# Patient Record
Sex: Female | Born: 2000 | Race: White | Hispanic: No | Marital: Single | Smoking: Never smoker
Health system: Southern US, Community
[De-identification: ages and names within clinical notes are randomized; demographics above are authoritative.]

## PROBLEM LIST (undated history)

## (undated) DIAGNOSIS — Z789 Other specified health status: Secondary | ICD-10-CM

## (undated) HISTORY — PX: NO PAST SURGERIES: SHX2092

## (undated) HISTORY — DX: Other specified health status: Z78.9

---
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
## 2021-01-29 ENCOUNTER — Other Ambulatory Visit (HOSPITAL_COMMUNITY)
Admission: RE | Admit: 2021-01-29 | Discharge: 2021-01-29 | Disposition: A | Discharge status: Home / Self Care | Payer: Medicaid Other | Source: Ambulatory Visit | Attending: Obstetrics and Gynecology | Admitting: Obstetrics and Gynecology

## 2021-01-29 ENCOUNTER — Ambulatory Visit (INDEPENDENT_AMBULATORY_CARE_PROVIDER_SITE_OTHER): Payer: Medicaid Other | Admitting: *Deleted

## 2021-01-29 ENCOUNTER — Other Ambulatory Visit: Payer: Self-pay

## 2021-01-29 VITALS — BP 115/72 | HR 60

## 2021-01-29 DIAGNOSIS — Z113 Encounter for screening for infections with a predominantly sexual mode of transmission: Secondary | ICD-10-CM | POA: Diagnosis present

## 2021-01-29 DIAGNOSIS — N898 Other specified noninflammatory disorders of vagina: Secondary | ICD-10-CM

## 2021-01-29 NOTE — Progress Notes (Signed)
SUBJECTIVE:  20 y.o. female complains of vaginal odor for few day(s). Denies abnormal vaginal bleeding or significant pelvic pain or fever. No UTI symptoms. Denies history of known exposure to STD.  No LMP recorded.  OBJECTIVE:  She appears well, afebrile. Urine dipstick: not done.  ASSESSMENT:  STI Screen Vaginal Odor   PLAN:  GC, chlamydia, trichomonas, BVAG, CVAG probe sent to lab. Treatment: To be determined once lab results are received ROV prn if symptoms persist or worsen.

## 2021-01-31 NOTE — Progress Notes (Signed)
Patient seen and assessed by nursing staff.  Agree with documentation and plan.  

## 2021-02-01 LAB — CERVICOVAGINAL ANCILLARY ONLY
Bacterial Vaginitis (gardnerella): POSITIVE — AB
Candida Glabrata: NEGATIVE
Candida Vaginitis: NEGATIVE
Chlamydia: NEGATIVE
Comment: NEGATIVE
Comment: NEGATIVE
Comment: NEGATIVE
Comment: NEGATIVE
Comment: NEGATIVE
Comment: NORMAL
Neisseria Gonorrhea: NEGATIVE
Trichomonas: NEGATIVE

## 2021-02-02 ENCOUNTER — Other Ambulatory Visit: Payer: Self-pay | Admitting: Family Medicine

## 2021-02-02 MED ORDER — METRONIDAZOLE 500 MG PO TABS: 500.0000 mg | ORAL_TABLET | Freq: Two times a day (BID) | ORAL | 0 refills | Status: DC

## 2021-02-04 ENCOUNTER — Ambulatory Visit (INDEPENDENT_AMBULATORY_CARE_PROVIDER_SITE_OTHER): Payer: Medicaid Other | Admitting: Family Medicine

## 2021-02-04 ENCOUNTER — Other Ambulatory Visit: Payer: Self-pay

## 2021-02-04 ENCOUNTER — Encounter: Payer: Self-pay | Admitting: Family Medicine

## 2021-02-04 VITALS — BP 106/71 | HR 80 | Ht 64.0 in | Wt 146.0 lb

## 2021-02-04 DIAGNOSIS — Z30431 Encounter for routine checking of intrauterine contraceptive device: Secondary | ICD-10-CM | POA: Diagnosis not present

## 2021-02-04 NOTE — Progress Notes (Signed)
Pt presents for IUD check. She is experiencing discomfort during intercourse. Partner can feel the strings.  Post placental Liletta IUD was inserted 02/26/2020.

## 2021-02-05 ENCOUNTER — Encounter: Payer: Self-pay | Admitting: Family Medicine

## 2021-02-05 NOTE — Progress Notes (Signed)
   Subjective:    Patient ID: Margaret Chambers is a 20 y.o. female presenting with Contraception (IUD check )  on 02/04/2021  HPI:  For IUD string check. Partner can feel strings. Placed post-placentally.  Review of Systems  Constitutional:  Negative for chills and fever.  Respiratory:  Negative for shortness of breath.   Cardiovascular:  Negative for chest pain.  Gastrointestinal:  Negative for abdominal pain, nausea and vomiting.  Genitourinary:  Negative for dysuria.  Skin:  Negative for rash.     Objective:    BP 106/71   Pulse 80   Ht 5\' 4"  (1.626 m)   Wt 146 lb (66.2 kg)   LMP 01/28/2021   BMI 25.06 kg/m  Physical Exam Constitutional:      General: She is not in acute distress.    Appearance: She is well-developed.  HENT:     Head: Normocephalic and atraumatic.  Eyes:     General: No scleral icterus. Cardiovascular:     Rate and Rhythm: Normal rate.  Pulmonary:     Effort: Pulmonary effort is normal.  Abdominal:     Palpations: Abdomen is soft.  Genitourinary:    Comments: BUS normal, vagina is pink and rugated, cervix is parous without lesion IUD strings trimmed to 2.5 cm.  Musculoskeletal:     Cervical back: Neck supple.  Skin:    General: Skin is warm and dry.  Neurological:     Mental Status: She is alert and oriented to person, place, and time.        Assessment & Plan:  IUD check up - strings trimmed.  Return if symptoms worsen or fail to improve.  01/30/2021 02/05/2021 2:22 PM

## 2021-07-24 IMAGING — US US MFM OB FOLLOW-UP
1 series · 14 of 28 positions shown · non-contrast
Comparison: none

[Series 1: us mfm ob follow-up · 43 acquisitions, 14 frames shown]
[im 2/43]
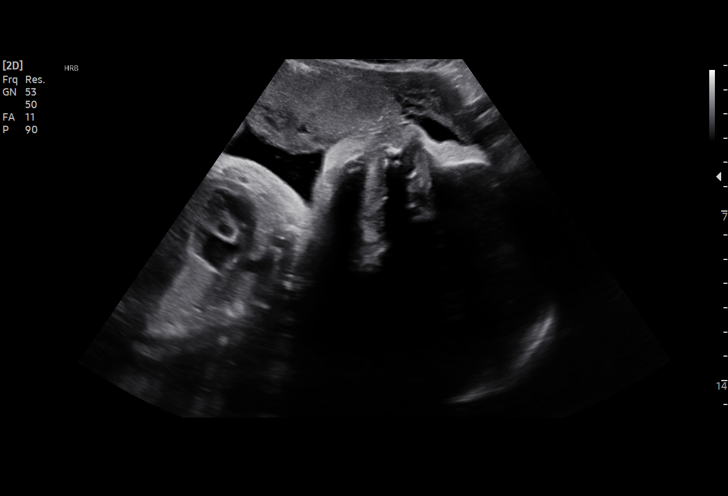
[im 5/43]
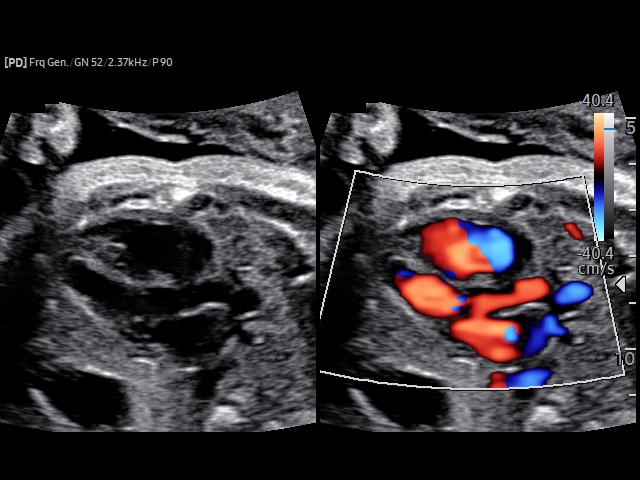
[im 8/43]
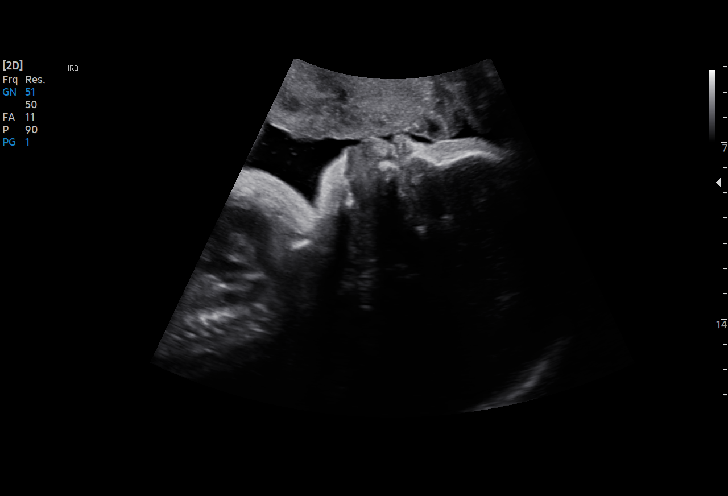
[im 11/43]
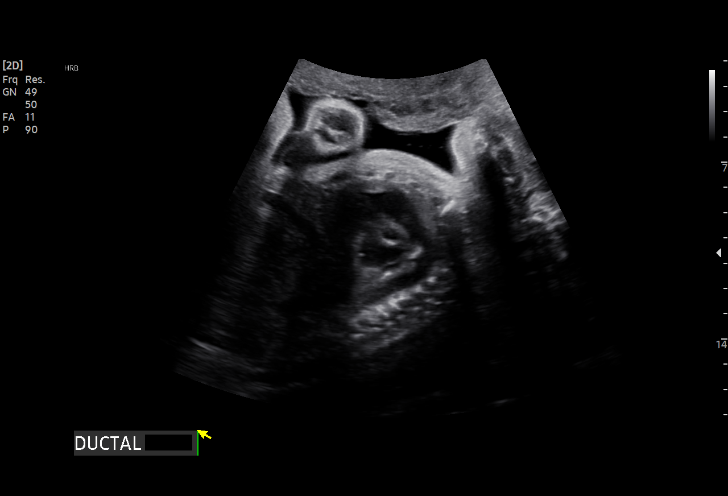
[im 15/43]
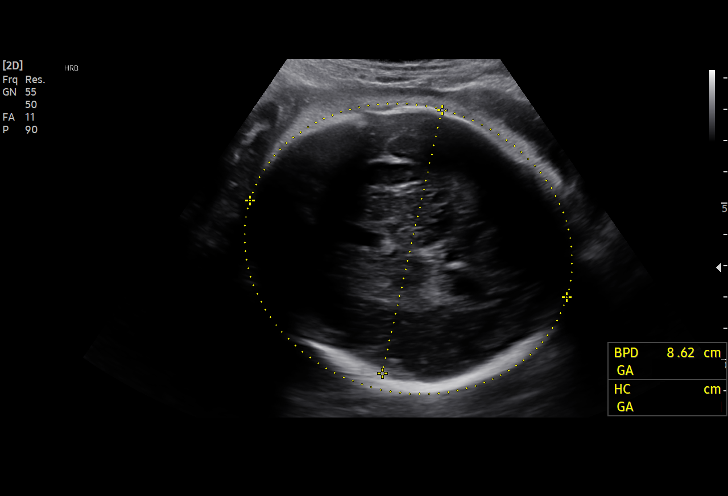
[im 18/43]
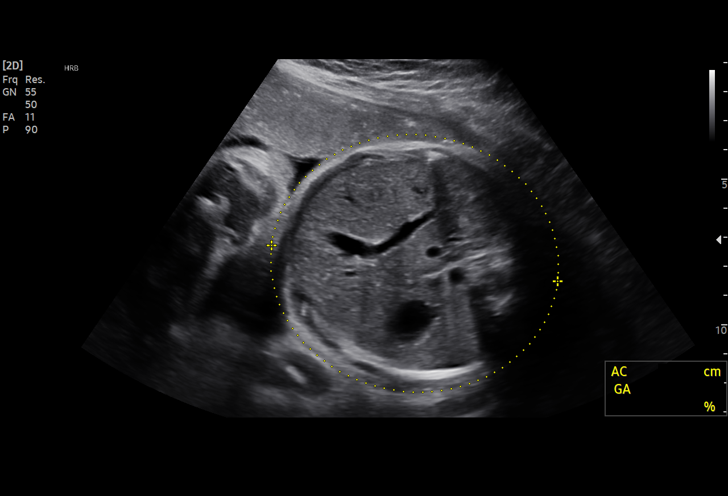
[im 21/43]
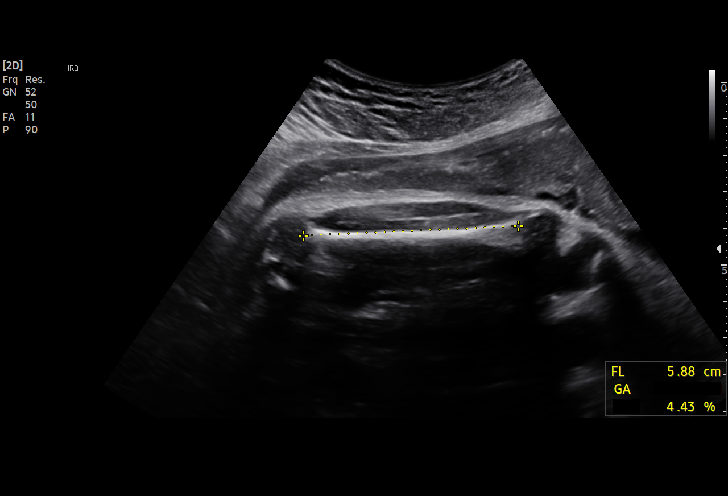
[im 24/43]
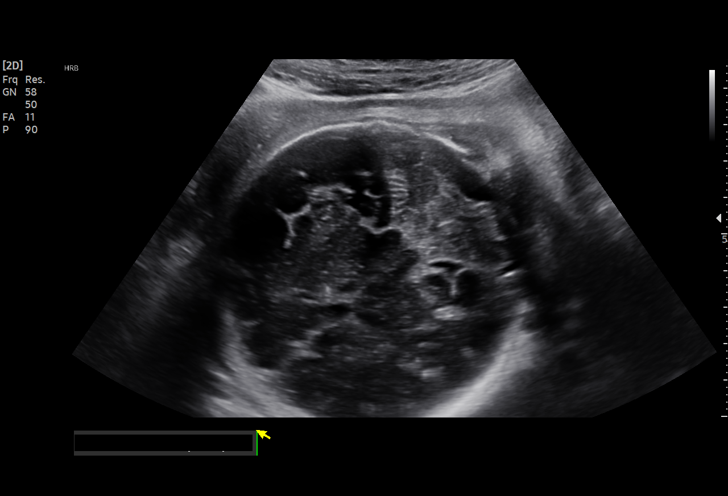
[im 27/43]
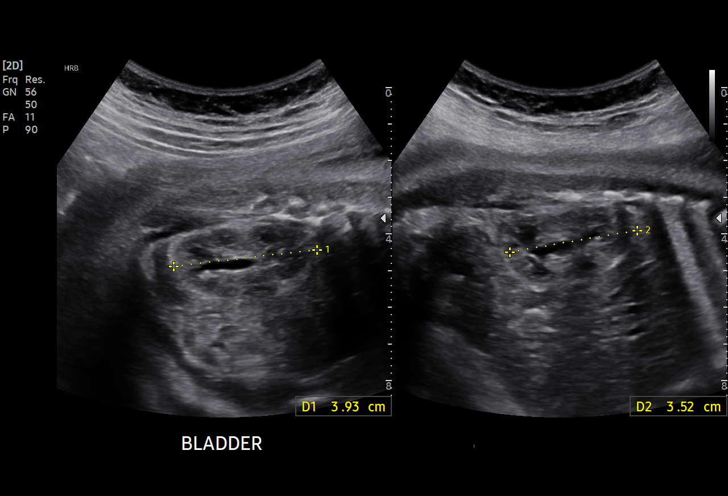
[im 30/43]
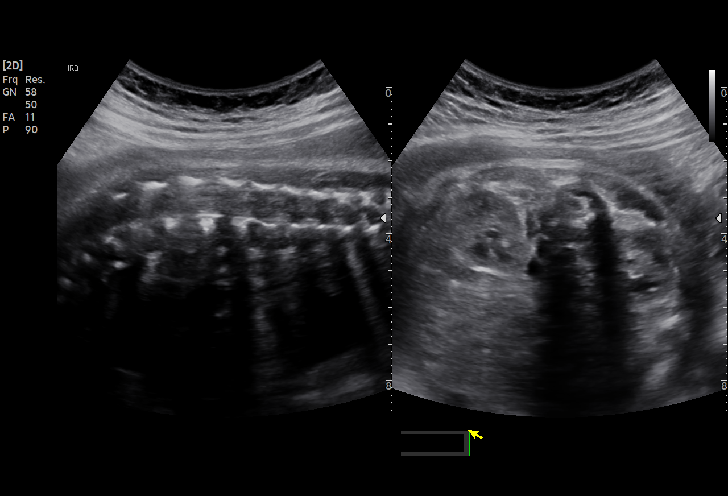
[im 33/43]
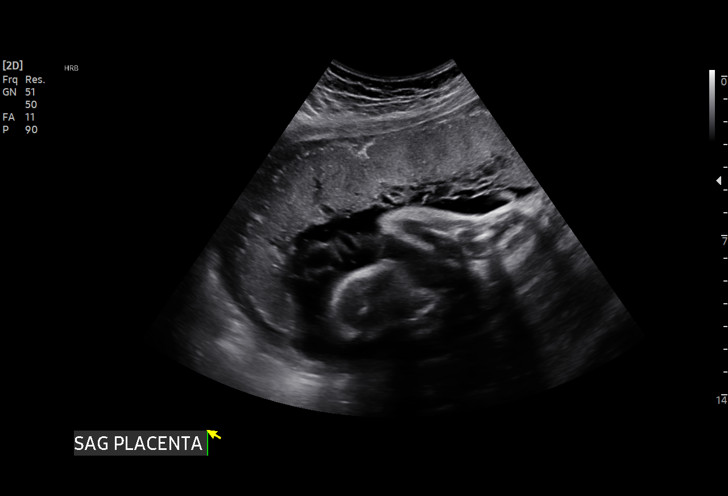
[im 36/43]
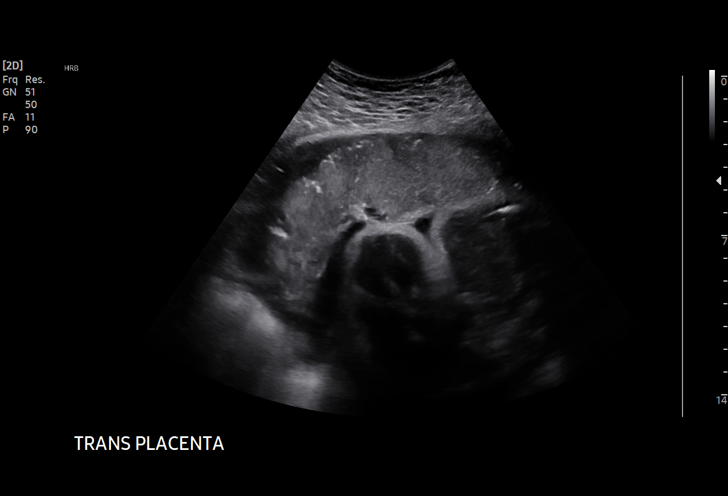
[im 39/43]
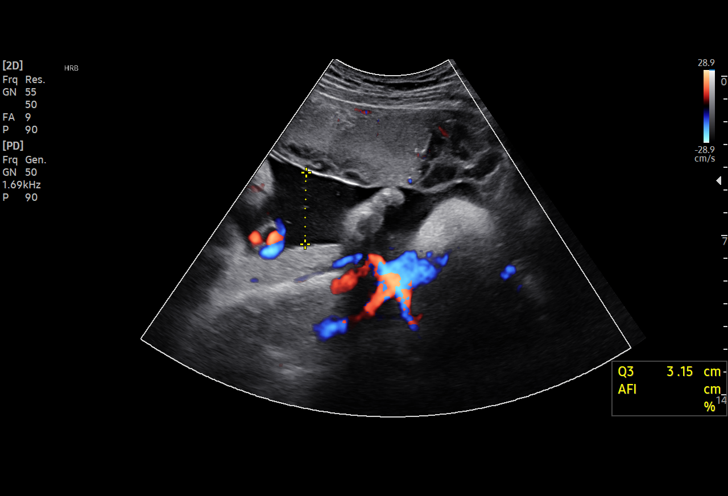
[im 43/43]
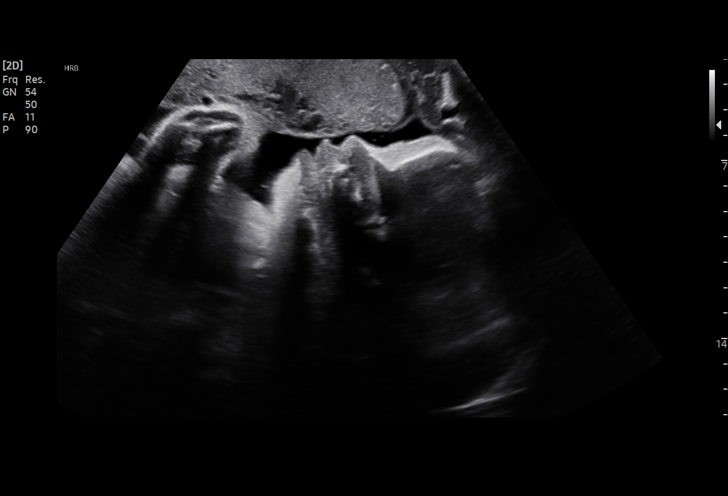

[14 of 28 positions shown; findings below may reference images not displayed]

Indications

 Late to prenatal care, third trimester
 Teen pregnancy (18 y.o.)
 Encounter for other antenatal screening
 follow-up
 Low Risk NIPS, Neg Horizon, Neg CF
 Marijuana Use
 32 weeks gestation of pregnancy
Fetal Evaluation

 Num Of Fetuses:          1
 Fetal Heart Rate(bpm):   138
 Cardiac Activity:        Observed
 Presentation:            Cephalic
 Placenta:                Anterior
 P. Cord Insertion:       Previously Visualized

 Amniotic Fluid
 AFI FV:      Within normal limits

 AFI Sum(cm)     %Tile       Largest Pocket(cm)
 14.3            50

 RUQ(cm)       RLQ(cm)       LUQ(cm)        LLQ(cm)
 3.8           2
Biometry
 BPD:      85.3  mm     G. Age:  34w 3d         89  %    CI:        77.69   %    70 - 86
                                                         FL/HC:       19.2  %    19.9 -
 HC:      306.3  mm     G. Age:  34w 1d         54  %    HC/AC:       1.05       0.96 -
 AC:      292.9  mm     G. Age:  33w 2d         71  %    FL/BPD:      68.9  %    71 - 87
 FL:       58.8  mm     G. Age:  30w 5d        4.4  %    FL/AC:       20.1  %    20 - 24
 LV:        4.9  mm

 Est. FW:    2337   gm     4 lb 8 oz     44  %
OB History

 Gravidity:    1         Term:   0
 Living:       0
Gestational Age

 LMP:           32w 4d        Date:  05/26/19                 EDD:   03/01/20
 U/S Today:     33w 1d                                        EDD:   02/26/20
 Best:          32w 4d     Det. By:  LMP  (05/26/19)          EDD:   03/01/20
Anatomy

 Cranium:               Appears normal         Aortic Arch:            Previously seen
 Cavum:                 Appears normal         Ductal Arch:            Appears normal
 Ventricles:            Appears normal         Diaphragm:              Appears normal
 Choroid Plexus:        Previously seen        Stomach:                Appears normal, left
                                                                       sided
 Cerebellum:            Previously seen        Abdomen:                Appears normal
 Posterior Fossa:       Appears normal         Abdominal Wall:         Previously seen
 Nuchal Fold:           Not applicable (>20    Cord Vessels:           Previously seen
                        wks GA)
 Face:                  Appears normal         Kidneys:                Appear normal
                        (orbits and profile)
 Lips:                  Appears normal         Bladder:                Appears normal
 Thoracic:              Appears normal         Spine:                  Ltd views no
                                                                       intracranial signs of
                                                                       NTD
 Heart:                 Previously seen        Upper Extremities:      Previously seen
 RVOT:                  Previously seen        Lower Extremities:      Previously seen
 LVOT:                  Appears normal

 Other:  Fetus appears to be a male. Technically difficult due to fetal position.
Cervix Uterus Adnexa

 Cervix
 Not visualized (advanced GA >72wks)
Impression

 Follow up growth given maternal teen pregnancy
 Normal interval growth with measurements consistent with
 dates.
Recommendations

 Consider follow up as clinically indicated.

## 2021-11-18 ENCOUNTER — Ambulatory Visit: Payer: Medicaid Other | Admitting: Obstetrics and Gynecology

## 2021-11-30 ENCOUNTER — Other Ambulatory Visit (HOSPITAL_COMMUNITY)
Admission: RE | Admit: 2021-11-30 | Discharge: 2021-11-30 | Disposition: A | Discharge status: Home / Self Care | Payer: Medicaid Other | Source: Ambulatory Visit | Attending: Obstetrics & Gynecology | Admitting: Obstetrics & Gynecology

## 2021-11-30 ENCOUNTER — Ambulatory Visit (INDEPENDENT_AMBULATORY_CARE_PROVIDER_SITE_OTHER): Payer: Medicaid Other

## 2021-11-30 VITALS — BP 103/61 | HR 90

## 2021-11-30 DIAGNOSIS — Z113 Encounter for screening for infections with a predominantly sexual mode of transmission: Secondary | ICD-10-CM | POA: Diagnosis not present

## 2021-11-30 DIAGNOSIS — B9689 Other specified bacterial agents as the cause of diseases classified elsewhere: Secondary | ICD-10-CM

## 2021-11-30 NOTE — Progress Notes (Signed)
SUBJECTIVE:  21 y.o. female who desires a STI screen. Denies abnormal vaginal discharge, bleeding or significant pelvic pain. No UTI symptoms. Denies history of known exposure to STD.  No LMP recorded. (Menstrual status: IUD).  OBJECTIVE:  She appears well.   ASSESSMENT:  STI Screen   PLAN:  Pt offered STI blood screening-not indicated GC, chlamydia, and trichomonas probe sent to lab. Pt wants BV and Yeast testing as well Treatment: To be determined once lab results are received.  Pt follow up as needed.

## 2021-12-01 LAB — CERVICOVAGINAL ANCILLARY ONLY
Bacterial Vaginitis (gardnerella): POSITIVE — AB
Candida Glabrata: NEGATIVE
Candida Vaginitis: NEGATIVE
Chlamydia: NEGATIVE
Comment: NEGATIVE
Comment: NEGATIVE
Comment: NEGATIVE
Comment: NEGATIVE
Comment: NEGATIVE
Comment: NORMAL
Neisseria Gonorrhea: NEGATIVE
Trichomonas: NEGATIVE

## 2021-12-02 MED ORDER — METRONIDAZOLE 500 MG PO TABS: 500.0000 mg | ORAL_TABLET | Freq: Two times a day (BID) | ORAL | 0 refills | Status: AC

## 2021-12-30 ENCOUNTER — Ambulatory Visit: Payer: Medicaid Other | Admitting: Obstetrics and Gynecology

## 2022-04-14 ENCOUNTER — Encounter: Payer: Self-pay | Admitting: Advanced Practice Midwife

## 2022-04-14 ENCOUNTER — Ambulatory Visit (INDEPENDENT_AMBULATORY_CARE_PROVIDER_SITE_OTHER): Payer: Medicaid Other | Admitting: Advanced Practice Midwife

## 2022-04-14 ENCOUNTER — Other Ambulatory Visit (HOSPITAL_COMMUNITY)
Admission: RE | Admit: 2022-04-14 | Discharge: 2022-04-14 | Disposition: A | Discharge status: Home / Self Care | Payer: Medicaid Other | Source: Ambulatory Visit | Attending: Advanced Practice Midwife | Admitting: Advanced Practice Midwife

## 2022-04-14 VITALS — BP 119/72 | HR 90 | Wt 129.0 lb

## 2022-04-14 DIAGNOSIS — N760 Acute vaginitis: Secondary | ICD-10-CM

## 2022-04-14 DIAGNOSIS — B9689 Other specified bacterial agents as the cause of diseases classified elsewhere: Secondary | ICD-10-CM

## 2022-04-14 DIAGNOSIS — Z975 Presence of (intrauterine) contraceptive device: Secondary | ICD-10-CM | POA: Diagnosis not present

## 2022-04-14 DIAGNOSIS — Z01419 Encounter for gynecological examination (general) (routine) without abnormal findings: Secondary | ICD-10-CM

## 2022-04-14 MED ORDER — METRONIDAZOLE 500 MG PO TABS: 500.0000 mg | ORAL_TABLET | Freq: Two times a day (BID) | ORAL | 0 refills | Status: DC

## 2022-04-14 NOTE — Progress Notes (Signed)
Patient presents for GYN/Annual  visit today.  LMP: irregular w/IUD will be very light. Contraception: IUD  STD Screening: Desires Full Panel. Family Hx of Breast Cancer: None    CC: yellowish discharge and vaginal odor.

## 2022-04-14 NOTE — Progress Notes (Signed)
GYNECOLOGY ANNUAL PREVENTATIVE CARE ENCOUNTER NOTE  History:     Margaret Chambers is a 21 y.o. G68P1001 female here for a routine annual gynecologic exam.  Current complaints: none. Patient requests STD screening.   Denies abnormal vaginal bleeding, discharge, pelvic pain, problems with intercourse or other gynecologic concerns.    Gynecologic History No LMP recorded. (Menstrual status: IUD). Contraception: IUD Last Pap: N/A age 68.   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     No current facility-administered medications on file prior to visit.    No Known Allergies  Social History:  reports that she has never smoked. She has never used smokeless tobacco. She reports that she does not currently use alcohol. She reports current drug use. Drug: Marijuana.  No family history on file.  The following portions of the patient's history were reviewed and updated as appropriate: allergies, current medications, past family history, past medical history, past social history, past surgical history and problem list.  Review of Systems Pertinent items noted in HPI and remainder of comprehensive ROS otherwise negative.  Physical Exam:  BP 119/72   Pulse 90   Wt 129 lb (58.5 kg)   BMI 22.14 kg/m  CONSTITUTIONAL: Well-developed, well-nourished female in no acute distress.  HENT:  Normocephalic,  atraumatic, External right and left ear normal.  EYES: Conjunctivae and EOM are normal. Pupils are equal, round, and reactive to light. No scleral icterus.  SKIN: Skin is warm and dry. No rash noted. Not diaphoretic. No erythema. No pallor. MUSCULOSKELETAL: Normal range of motion. No tenderness.  No cyanosis, clubbing, or edema. NEUROLOGIC: Alert and oriented to person, place, and time. Normal reflexes, muscle tone coordination.  PSYCHIATRIC: Normal mood and affect. Normal behavior. Normal judgment and thought content. CARDIOVASCULAR: Normal heart rate noted RESPIRATORY: normal WOB PELVIC: Normal appearing external genitalia and urethral meatus; normal appearing vaginal mucosa and cervix.  Thin whitish blue discharge throughout vault c/w Bacterial Vaginosis.   Performed in the presence of a chaperone.   Assessment and Plan:    1. Well woman exam with routine gynecological exam - No concerning findings - Hepatitis B surface antigen - Hepatitis C antibody - RPR - HIV Antibody (routine testing w rflx)  2. IUD (intrauterine device) in place - Liletta placed postplacentally on 02/26/2020 - No complaints - Strings appropriate length  3. Bacterial vaginosis - Thin white discharge throughout vault - Treat presumptively while swab is processed - Cervicovaginal ancillary only - metroNIDAZOLE (FLAGYL) 500 MG tablet; Take 1 tablet (500 mg total) by mouth 2 (two) times daily.  Dispense: 14 tablet; Refill: 0  Will follow up on results of cervicovaginal swab and manage accordingly Routine preventative health maintenance measures emphasized. Please refer to After Visit Summary  for other counseling recommendations.      Clayton Bibles, MSA, MSN, CNM Certified Nurse Midwife, Biochemist, clinical for [employer], [facility]

## 2022-04-15 LAB — HEPATITIS B SURFACE ANTIGEN: Hepatitis B Surface Ag: NEGATIVE

## 2022-04-15 LAB — RPR: RPR Ser Ql: NONREACTIVE

## 2022-04-15 LAB — HIV ANTIBODY (ROUTINE TESTING W REFLEX): HIV Screen 4th Generation wRfx: NONREACTIVE

## 2022-04-15 LAB — HEPATITIS C ANTIBODY: Hep C Virus Ab: NONREACTIVE

## 2022-04-18 LAB — CERVICOVAGINAL ANCILLARY ONLY
Chlamydia: NEGATIVE
Comment: NEGATIVE
Comment: NEGATIVE
Comment: NORMAL
Neisseria Gonorrhea: NEGATIVE
Trichomonas: NEGATIVE

## 2022-11-15 ENCOUNTER — Encounter: Payer: Self-pay | Admitting: Family Medicine

## 2022-12-21 ENCOUNTER — Encounter: Payer: Self-pay | Admitting: Family Medicine

## 2022-12-21 ENCOUNTER — Ambulatory Visit (INDEPENDENT_AMBULATORY_CARE_PROVIDER_SITE_OTHER): Payer: Self-pay | Admitting: Family Medicine

## 2022-12-21 VITALS — BP 111/73 | HR 86 | Wt 132.0 lb

## 2022-12-21 DIAGNOSIS — Z30432 Encounter for removal of intrauterine contraceptive device: Secondary | ICD-10-CM

## 2022-12-21 DIAGNOSIS — Z30011 Encounter for initial prescription of contraceptive pills: Secondary | ICD-10-CM

## 2022-12-21 MED ORDER — NORETHIN ACE-ETH ESTRAD-FE 1-20 MG-MCG PO TABS: 1.0000 | ORAL_TABLET | Freq: Every day | ORAL | 3 refills | Status: DC

## 2022-12-21 NOTE — Progress Notes (Signed)
RGYN patient here for problem visit.  Pt c/o irregular bleeding w/current birth Control Liletta whole month of June and into July recently stopped   Inserted 02/2020.  Pt wants IUD removed and wants pills/Junel.

## 2022-12-21 NOTE — Progress Notes (Signed)
    Subjective:    Patient ID: Margaret Chambers is a 22 y.o. female presenting with Menstrual Problem  on 12/21/2022  HPI: Here to change contraception methods. Has Liletta placed post-placentally and has been having bleeding post intercourse and partner can feel strings. Wants to try COCs. Does not want a pregnancy in the next year.  Review of Systems  Constitutional:  Negative for chills and fever.  Respiratory:  Negative for shortness of breath.   Cardiovascular:  Negative for chest pain.  Gastrointestinal:  Negative for abdominal pain, nausea and vomiting.  Genitourinary:  Negative for dysuria.  Skin:  Negative for rash.      Objective:    BP 111/73   Pulse 86   Wt 132 lb (59.9 kg)   BMI 22.66 kg/m  Physical Exam Exam conducted with a chaperone present.  Constitutional:      General: She is not in acute distress.    Appearance: She is well-developed.  HENT:     Head: Normocephalic and atraumatic.  Eyes:     General: No scleral icterus. Cardiovascular:     Rate and Rhythm: Normal rate.  Pulmonary:     Effort: Pulmonary effort is normal.  Abdominal:     Palpations: Abdomen is soft.  Genitourinary:    Comments: BUS normal, vagina is pink and rugated, cervix is parous without lesion Musculoskeletal:     Cervical back: Neck supple.  Skin:    General: Skin is warm and dry.  Neurological:     Mental Status: She is alert and oriented to person, place, and time.    Procedure: Speculum placed inside vagina.  Cervix visualized.  Strings grasped with ring forceps.  IUD removed intact.     Assessment & Plan:   Initiation of oral contraception - Plan: norethindrone-ethinyl estradiol-FE (JUNEL FE 1/20) 1-20 MG-MCG tablet  Encounter for IUD removal  Return in about 3 months (around 03/23/2023) for a CPE.  Reva Bores, MD 12/21/2022 11:20 AM

## 2023-10-09 ENCOUNTER — Ambulatory Visit: Payer: Self-pay

## 2023-10-09 VITALS — BP 124/60 | Ht 63.0 in | Wt 157.0 lb

## 2023-10-09 DIAGNOSIS — Z309 Encounter for contraceptive management, unspecified: Secondary | ICD-10-CM

## 2023-10-09 DIAGNOSIS — Z3201 Encounter for pregnancy test, result positive: Secondary | ICD-10-CM

## 2023-10-09 LAB — PREGNANCY, URINE: Preg Test, Ur: POSITIVE — AB

## 2023-10-09 MED ORDER — PRENATAL 27-0.8 MG PO TABS: 1.0000 | ORAL_TABLET | Freq: Every day | ORAL | Status: AC

## 2023-10-09 NOTE — Progress Notes (Signed)
 UPT positive. Unsure of plans as of right now. Patient unsure of LMP, so encouraged patient to schedule U/S to help determine her plans. List of providers given to patient.   The patient was dispensed prenatal vtiamins #100 today. I provided counseling today regarding the medication. We discussed the medication, the side effects and when to call clinic.   Positive pregnancy packet reviewed and given to patient. Also counseled on hydration and when to seek medical attention.   Patient given the opportunity to ask questions. Questions answered.  Clare Critchley, RN

## 2023-10-10 ENCOUNTER — Ambulatory Visit (INDEPENDENT_AMBULATORY_CARE_PROVIDER_SITE_OTHER): Payer: Self-pay | Admitting: *Deleted

## 2023-10-10 ENCOUNTER — Other Ambulatory Visit: Payer: Self-pay

## 2023-10-10 VITALS — BP 122/72 | HR 91 | Wt 157.0 lb

## 2023-10-10 DIAGNOSIS — O3680X0 Pregnancy with inconclusive fetal viability, not applicable or unspecified: Secondary | ICD-10-CM

## 2023-10-10 DIAGNOSIS — Z3A01 Less than 8 weeks gestation of pregnancy: Secondary | ICD-10-CM

## 2023-10-10 DIAGNOSIS — Z3201 Encounter for pregnancy test, result positive: Secondary | ICD-10-CM

## 2023-10-10 DIAGNOSIS — Z3480 Encounter for supervision of other normal pregnancy, unspecified trimester: Secondary | ICD-10-CM

## 2023-10-10 DIAGNOSIS — Z3481 Encounter for supervision of other normal pregnancy, first trimester: Secondary | ICD-10-CM

## 2023-10-10 NOTE — Progress Notes (Signed)
 New OB Intake  I explained I am completing New OB Intake today. We discussed EDD of 05/25/2024, by Ultrasound. Pt is G2P1001. I reviewed her allergies, medications and Medical/Surgical/OB history.    Patient Active Problem List   Diagnosis Date Noted   Supervision of other normal pregnancy, antepartum 10/10/2023    Concerns addressed today  Patient informed that the ultrasound is considered a limited obstetric ultrasound and is not intended to be a complete ultrasound exam.  Patient also informed that the ultrasound is not being completed with the intent of assessing for fetal or placental anomalies or any pelvic abnormalities. Explained that the purpose of today's ultrasound is to assess for viability.  Patient acknowledges the purpose of the exam and the limitations of the study.     Delivery Plans Plans to deliver at [facility]. Discussed the nature of our practice with multiple providers including residents and students. Due to the size of the practice, the delivering provider may not be the same as those providing prenatal care.   MyChart/Babyscripts MyChart access verified. I explained pt will have some visits in office and some virtually. Babyscripts app discussed and ordered.   Blood Pressure Cuff Blood pressure cuff discussed and given Discussed to be used for virtual visits and or if needed BP checks weekly.  Anatomy US  Explained first scheduled US  will be around 19 weeks.   Last Pap Need pap at new OB  First visit review I reviewed new OB appt with patient. Explained pt will be seen by Dr Adriana Hopping at first visit. Discussed [facility] [place] genetic screening with patient will get panorama collected at [facility]. Routine prenatal labs to be collected at new OB.    Bettey Browning, RN 10/10/2023  3:59 PM

## 2023-11-08 ENCOUNTER — Encounter: Payer: Self-pay | Admitting: Family Medicine

## 2023-12-31 ENCOUNTER — Other Ambulatory Visit: Payer: Self-pay | Admitting: Family Medicine

## 2024-01-01 ENCOUNTER — Encounter: Payer: Self-pay | Admitting: Family Medicine

## 2024-01-03 ENCOUNTER — Telehealth: Payer: Self-pay | Admitting: *Deleted

## 2024-01-03 ENCOUNTER — Encounter: Payer: Self-pay | Admitting: *Deleted

## 2024-01-03 MED ORDER — NORETHIN ACE-ETH ESTRAD-FE 1-20 MG-MCG PO TABS: 1.0000 | ORAL_TABLET | Freq: Every day | ORAL | 11 refills | Status: AC

## 2024-01-03 NOTE — Telephone Encounter (Signed)
 Attempted to call pt regarding her mychart message about refilling her birth control. Left message for her to call me back.

## 2024-01-03 NOTE — Telephone Encounter (Signed)
 Pt called back, and did let me know she did decide to terminate her pregnancy in June and did have an US  confirming her pregnancy has resolved on June 4th.   Will send in OCP refill for her and she can start with next cycle and also reminded pt she is due for a pap this year ans she can make an appointment for that.

## 2024-01-04 ENCOUNTER — Ambulatory Visit: Payer: Self-pay

## 2024-01-04 ENCOUNTER — Other Ambulatory Visit: Payer: Self-pay

## 2024-05-02 ENCOUNTER — Encounter: Payer: Self-pay | Admitting: Family Medicine

## 2024-05-02 ENCOUNTER — Other Ambulatory Visit (HOSPITAL_COMMUNITY)
Admission: RE | Admit: 2024-05-02 | Discharge: 2024-05-02 | Disposition: A | Discharge status: Home / Self Care | Source: Ambulatory Visit | Attending: Family Medicine | Admitting: Family Medicine

## 2024-05-02 ENCOUNTER — Ambulatory Visit (INDEPENDENT_AMBULATORY_CARE_PROVIDER_SITE_OTHER): Admitting: Family Medicine

## 2024-05-02 VITALS — BP 120/71 | Wt 163.0 lb

## 2024-05-02 DIAGNOSIS — Z124 Encounter for screening for malignant neoplasm of cervix: Secondary | ICD-10-CM

## 2024-05-02 DIAGNOSIS — Z01419 Encounter for gynecological examination (general) (routine) without abnormal findings: Secondary | ICD-10-CM

## 2024-05-02 DIAGNOSIS — N939 Abnormal uterine and vaginal bleeding, unspecified: Secondary | ICD-10-CM | POA: Insufficient documentation

## 2024-05-02 MED ORDER — DOXYCYCLINE HYCLATE 100 MG PO CAPS: 100.0000 mg | ORAL_CAPSULE | Freq: Two times a day (BID) | ORAL | 0 refills | Status: AC

## 2024-05-02 NOTE — Patient Instructions (Signed)

## 2024-05-02 NOTE — Progress Notes (Signed)
 Subjective:     Margaret Chambers is a 23 y.o. female and is here for a comprehensive physical exam. The patient reports problems - bleeding.  Had TAB in 09/2023. Taking COCs. Sees some blood clots with with urination.  The following portions of the patient's history were reviewed and updated as appropriate: allergies, current medications, past family history, past medical history, past social history, past surgical history, and problem list.  Review of Systems Pertinent items noted in HPI and remainder of comprehensive ROS otherwise negative.   Objective:  Chaperone present for exam   BP 120/71   Wt 163 lb (73.9 kg)   LMP  (LMP Unknown) Comment: pt states she has had bleeding that is coming and going  Breastfeeding No   BMI 28.87 kg/m  General appearance: alert, cooperative, and appears stated age Head: Normocephalic, without obvious abnormality, atraumatic Neck: no adenopathy, supple, symmetrical, trachea midline, and thyroid not enlarged, symmetric, no tenderness/mass/nodules Lungs: clear to auscultation bilaterally Heart: regular rate and rhythm, S1, S2 normal, no murmur, click, rub or gallop Abdomen: soft, non-tender; bowel sounds normal; no masses,  no organomegaly Pelvic: cervix normal in appearance, external genitalia normal, no adnexal masses or tenderness, no cervical motion tenderness, uterus normal size, shape, and consistency, and vagina normal without discharge Extremities: extremities normal, atraumatic, no cyanosis or edema Pulses: 2+ and symmetric Skin: Skin color, texture, turgor normal. No rashes or lesions Lymph nodes: Cervical, supraclavicular, and axillary nodes normal. Neurologic: Grossly normal       05/02/2024    2:06 PM  GAD 7 : Generalized Anxiety Score  Nervous, Anxious, on Edge 0  Control/stop worrying 0  Worry too much - different things 0  Trouble relaxing 0  Restless 1  Easily annoyed or irritable 1  Afraid - awful might happen 0  Total GAD 7 Score  2    Flowsheet Row Office Visit from 05/02/2024 in [facility]  PHQ-9 Total Score 3    Assessment:    Healthy female exam.      Plan:   Problem List Items Addressed This Visit       Unprioritized   Abnormal uterine bleeding   99214 - new and not improving - check pelvic sonogram. Will add doxy. If does not resolve, consider other treatment options.      Relevant Medications   doxycycline (VIBRAMYCIN) 100 MG capsule   Other Relevant Orders   US  PELVIC COMPLETE WITH TRANSVAGINAL   CBC   TSH   Other Visit Diagnoses       Screening for malignant neoplasm of cervix    -  Primary   Pap smear with GC/Chlam testing   Relevant Orders   Cytology - PAP( [place])     Encounter for gynecological examination without abnormal finding       GAD7 and PHQ9 reviewed. Declined flu shot. No lab work due to age.         See After Visit Summary for Counseling Recommendations

## 2024-05-02 NOTE — Assessment & Plan Note (Signed)
 00785 - new and not improving - check pelvic sonogram. Will add doxy. If does not resolve, consider other treatment options.

## 2024-05-03 ENCOUNTER — Ambulatory Visit: Payer: Self-pay | Admitting: Family Medicine

## 2024-05-03 LAB — CBC
Hematocrit: 39 % (ref 34.0–46.6)
Hemoglobin: 12.8 g/dL (ref 11.1–15.9)
MCH: 29.3 pg (ref 26.6–33.0)
MCHC: 32.8 g/dL (ref 31.5–35.7)
MCV: 89 fL (ref 79–97)
Platelets: 327 x10E3/uL (ref 150–450)
RBC: 4.37 x10E6/uL (ref 3.77–5.28)
RDW: 13.3 % (ref 11.7–15.4)
WBC: 6.8 x10E3/uL (ref 3.4–10.8)

## 2024-05-03 LAB — TSH: TSH: 0.791 u[IU]/mL (ref 0.450–4.500)

## 2024-05-06 LAB — CYTOLOGY - PAP
Chlamydia: NEGATIVE
Comment: NEGATIVE
Comment: NORMAL
Diagnosis: NEGATIVE
Neisseria Gonorrhea: NEGATIVE

## 2024-05-07 ENCOUNTER — Ambulatory Visit
Admission: RE | Admit: 2024-05-07 | Discharge: 2024-05-07 | Disposition: A | Discharge status: Home / Self Care | Payer: Self-pay | Source: Ambulatory Visit | Attending: Family Medicine | Admitting: Family Medicine

## 2024-05-07 DIAGNOSIS — N939 Abnormal uterine and vaginal bleeding, unspecified: Secondary | ICD-10-CM | POA: Insufficient documentation
